# Patient Record
Sex: Male | Born: 2000 | Race: White | Hispanic: No | Marital: Single
Health system: Southern US, Community
[De-identification: ages and names within clinical notes are randomized; demographics above are authoritative.]

---
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
## 2021-05-13 IMAGING — DX DG HAND COMPLETE 3+V*L*
3 series · 3 of 3 positions shown · non-contrast
Comparison: None.

CLINICAL DATA: Crush injury to the left hand.

EXAM:
LEFT HAND - COMPLETE 3+ VIEW

[hand pa]
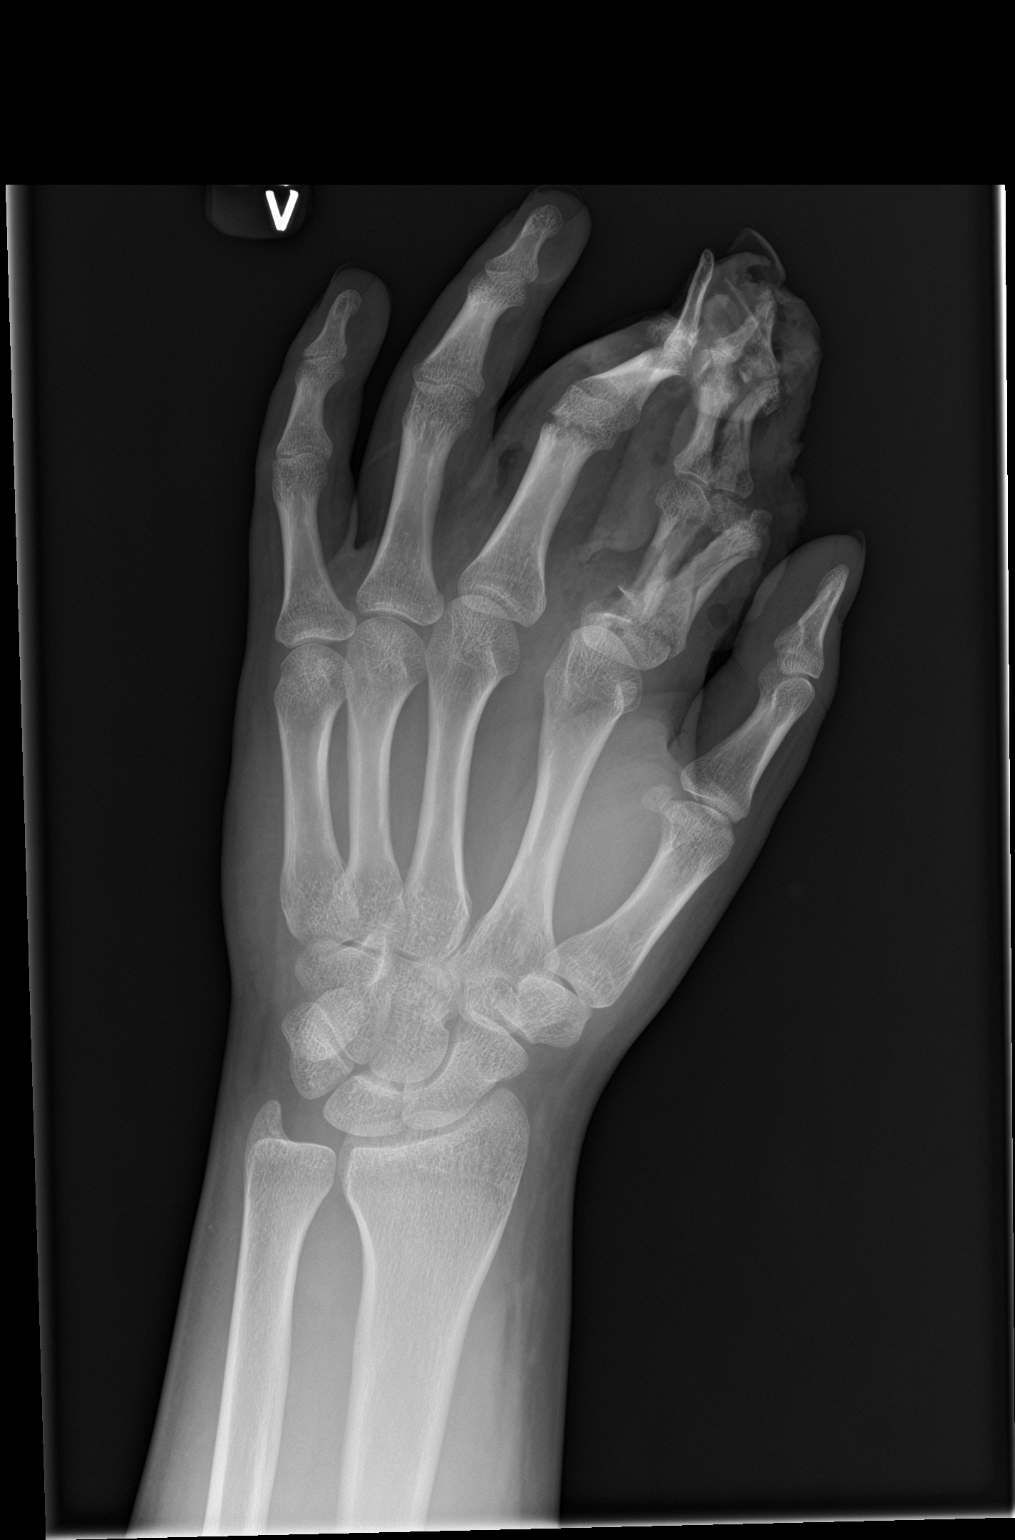

[hand obl]
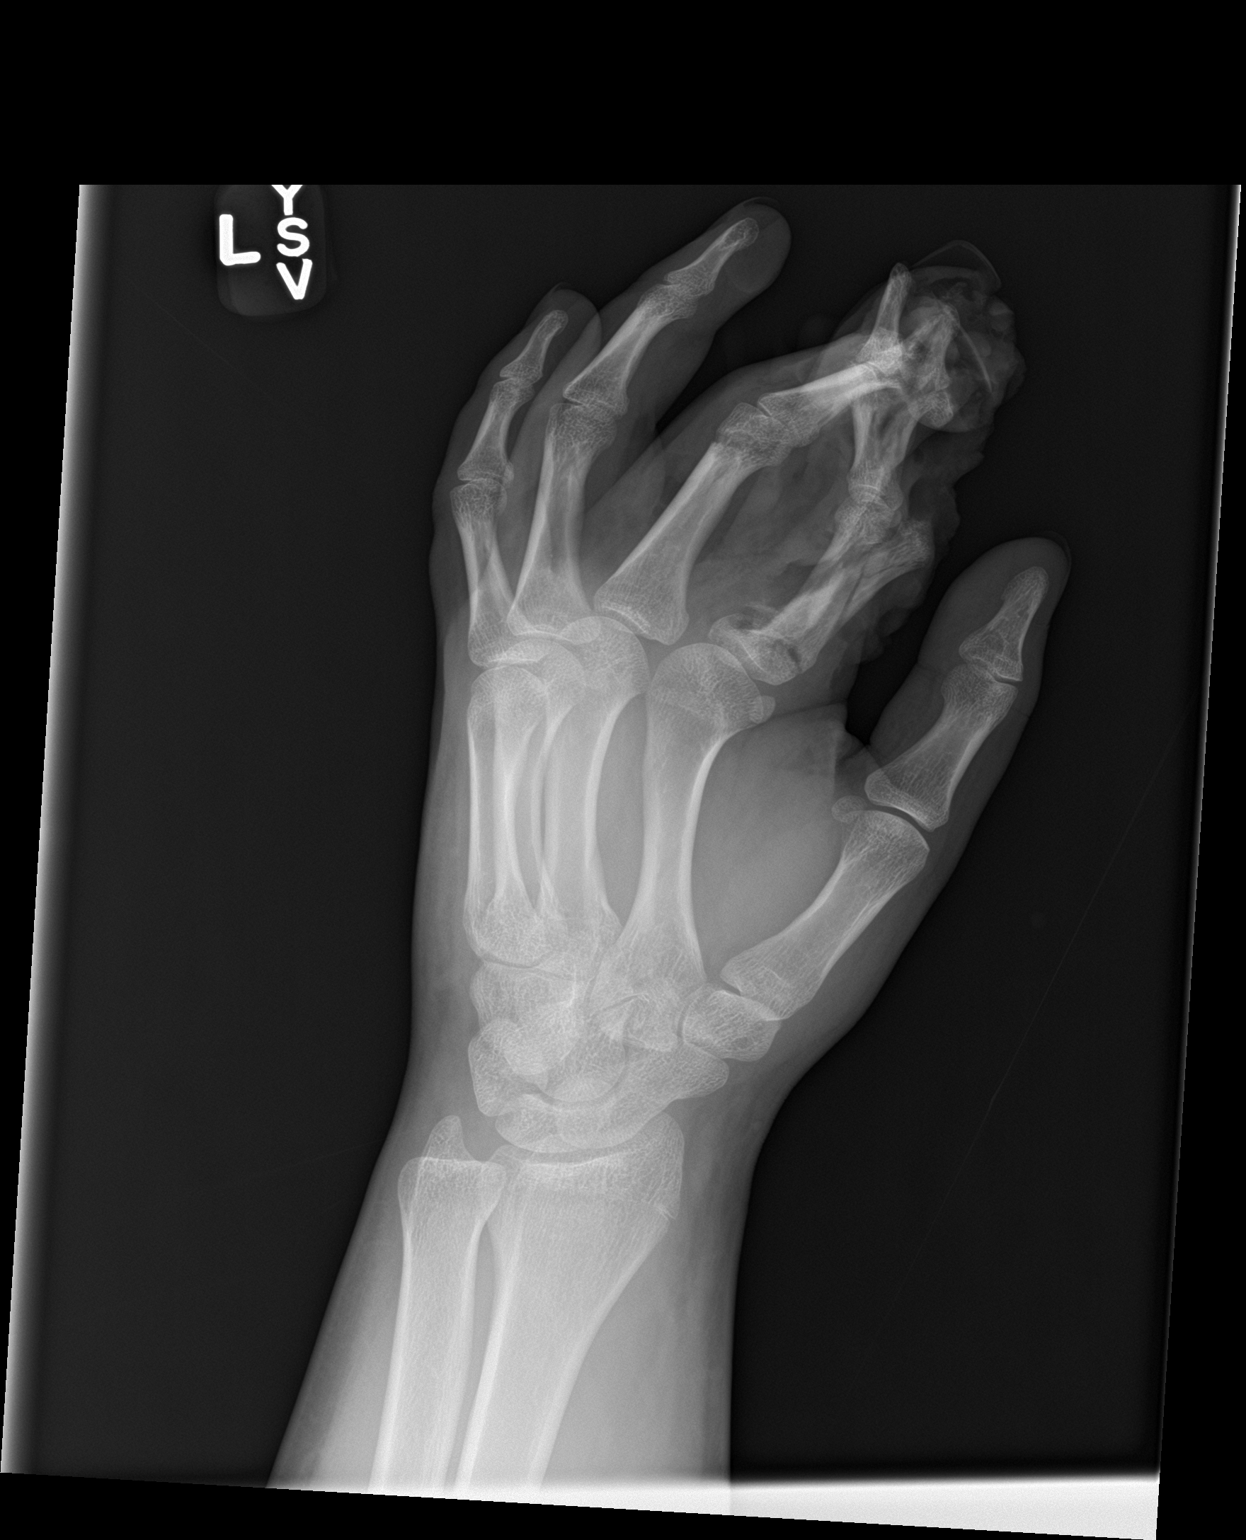

[hand lat]
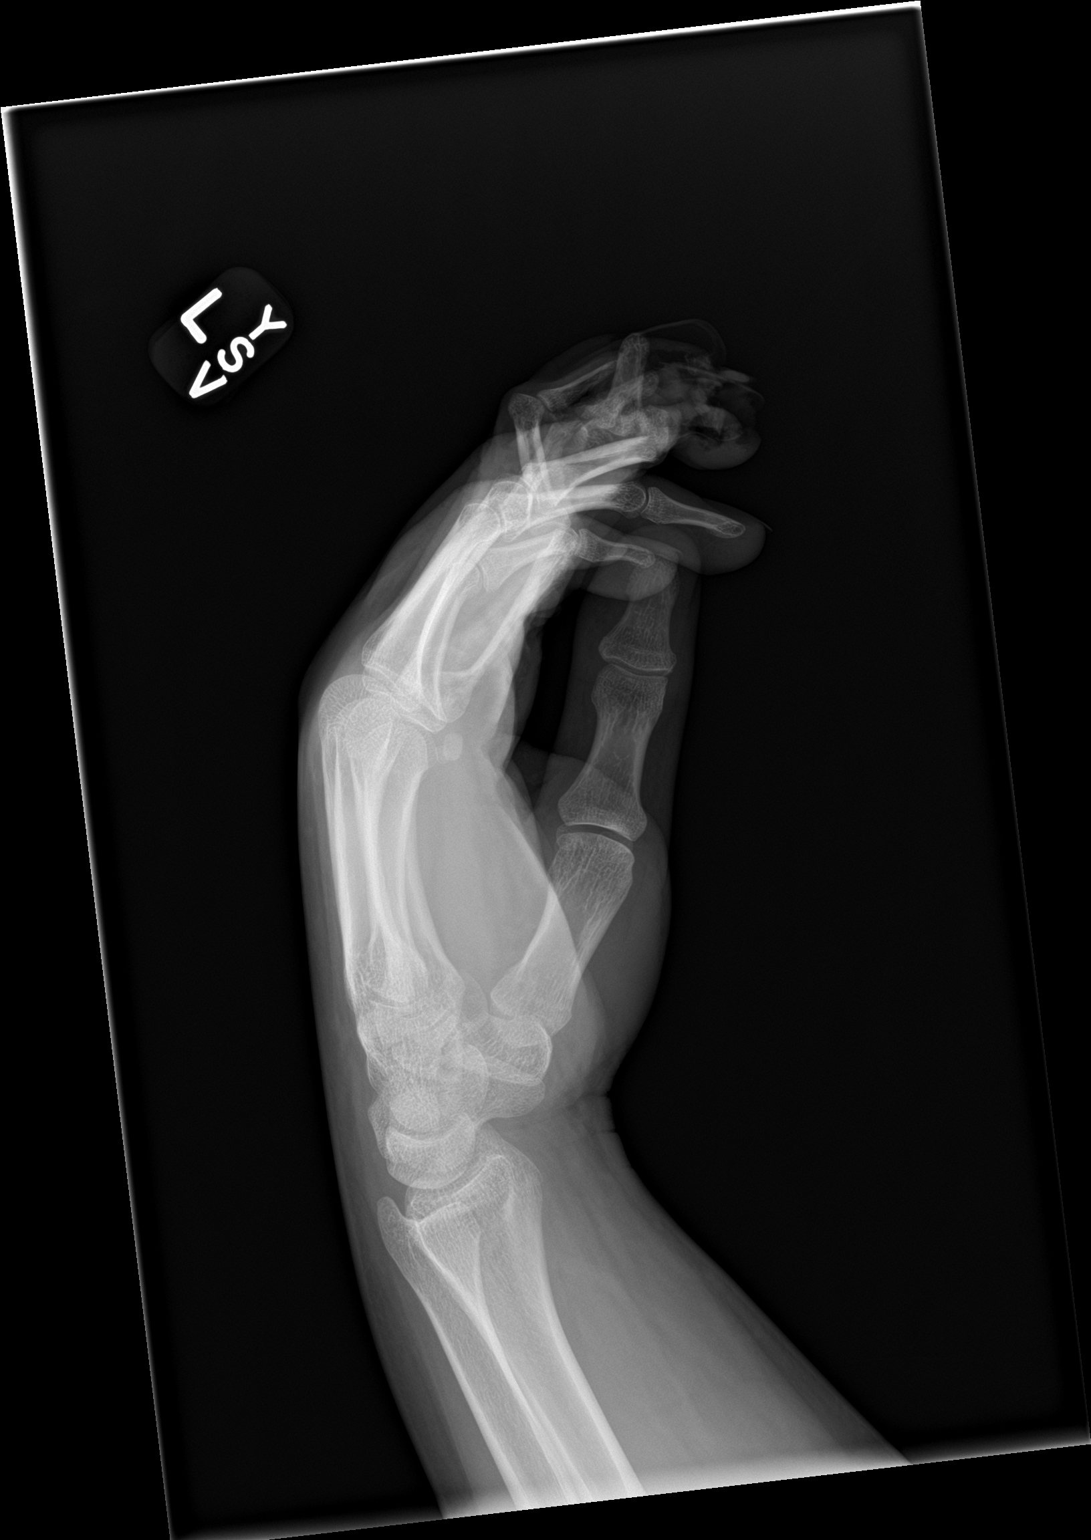

[3 of 3 positions shown; findings below may reference images not displayed]

FINDINGS: Positioning is suboptimal. The index and long fingers overlap. There
is a nondisplaced fracture through the neck of the 3rd proximal
phalanx. There is probable posterior dislocation at the 3rd DIP
joint with possible associated fracture of the 3rd middle phalangeal
head.

There are extensive injuries of the index finger with comminuted and
displaced fractures of the proximal and middle phalanges. The distal
phalanx is not well evaluated due to overlap with the long finger.

The 1st, 4th and 5th rays appear intact. There is diffuse soft
tissue swelling and emphysema in the index and long fingers. No
evidence of foreign body.
IMPRESSION: 1. Extensive injuries of the index and long fingers with probable
posterior dislocation at the 3rd DIP joint. The 2nd proximal and
middle phalanges appear extensively comminuted.
2. Evaluation is limited by overlap of the index and long fingers.
